# Patient Record
Sex: Female | Born: 1955 | Hispanic: Yes | Marital: Single | Smoking: Never smoker
Health system: Southern US, Community
[De-identification: ages and names within clinical notes are randomized; demographics above are authoritative.]

## PROBLEM LIST (undated history)

## (undated) DIAGNOSIS — I10 Essential (primary) hypertension: Secondary | ICD-10-CM

## (undated) HISTORY — PX: TUBAL LIGATION: SHX77

---

## 2020-06-16 ENCOUNTER — Emergency Department: Payer: HRSA Program

## 2020-06-16 ENCOUNTER — Emergency Department
Admission: EM | Admit: 2020-06-16 | Discharge: 2020-06-17 | Disposition: A | Discharge status: Home / Self Care | Payer: HRSA Program | Attending: Emergency Medicine | Admitting: Emergency Medicine

## 2020-06-16 ENCOUNTER — Encounter: Payer: Self-pay | Admitting: Emergency Medicine

## 2020-06-16 ENCOUNTER — Other Ambulatory Visit: Payer: Self-pay

## 2020-06-16 DIAGNOSIS — I10 Essential (primary) hypertension: Secondary | ICD-10-CM | POA: Diagnosis not present

## 2020-06-16 DIAGNOSIS — R059 Cough, unspecified: Secondary | ICD-10-CM | POA: Diagnosis present

## 2020-06-16 DIAGNOSIS — U071 COVID-19: Secondary | ICD-10-CM | POA: Diagnosis not present

## 2020-06-16 HISTORY — DX: Essential (primary) hypertension: I10

## 2020-06-16 LAB — BASIC METABOLIC PANEL
Anion gap: 10 (ref 5–15)
BUN: 11 mg/dL (ref 8–23)
CO2: 22 mmol/L (ref 22–32)
Calcium: 7.8 mg/dL — ABNORMAL LOW (ref 8.9–10.3)
Chloride: 103 mmol/L (ref 98–111)
Creatinine, Ser: 0.75 mg/dL (ref 0.44–1.00)
GFR, Estimated: >60 mL/min (ref >60)
Glucose, Bld: 160 mg/dL — ABNORMAL HIGH (ref 70–99)
Potassium: 3 mmol/L — ABNORMAL LOW (ref 3.5–5.1)
Sodium: 135 mmol/L (ref 135–145)

## 2020-06-16 LAB — CBC
HCT: 35.1 % — ABNORMAL LOW (ref 36.0–46.0)
Hemoglobin: 11.7 g/dL — ABNORMAL LOW (ref 12.0–15.0)
MCH: 28.4 pg (ref 26.0–34.0)
MCHC: 33.3 g/dL (ref 30.0–36.0)
MCV: 85.2 fL (ref 80.0–100.0)
Platelets: 201 10*3/uL (ref 150–400)
RBC: 4.12 MIL/uL (ref 3.87–5.11)
RDW: 12.9 % (ref 11.5–15.5)
WBC: 2.7 10*3/uL — ABNORMAL LOW (ref 4.0–10.5)
nRBC: 0 % (ref 0.0–0.2)

## 2020-06-16 LAB — POC SARS CORONAVIRUS 2 AG -  ED: SARS Coronavirus 2 Ag: POSITIVE — AB

## 2020-06-16 MED ORDER — POTASSIUM CHLORIDE CRYS ER 20 MEQ PO TBCR
40.0000 meq | EXTENDED_RELEASE_TABLET | Freq: Once | ORAL | Status: AC
  Administered 2020-06-17: 40 meq via ORAL
  Filled 2020-06-16: qty 2

## 2020-06-16 MED ORDER — ACETAMINOPHEN 500 MG PO TABS
1000.0000 mg | ORAL_TABLET | Freq: Once | ORAL | Status: AC
  Administered 2020-06-17: 1000 mg via ORAL
  Filled 2020-06-16: qty 2

## 2020-06-16 NOTE — ED Triage Notes (Signed)
Using spanish interpreter. Patient with complaint of cough times two days. Patient also states that she started feeling dizzy on Wednesday. Patient states that she thinks that she has been running fevers.

## 2020-06-16 NOTE — ED Provider Notes (Signed)
[facility] Emergency Department Provider Note  ____________________________________________   Event Date/Time   First MD Initiated Contact with Patient 06/16/20 2324     (approximate)  I have reviewed the triage vital signs and the nursing notes.   HISTORY  Chief Complaint Cough and Dizziness    HPI Mariah Mckee is a 65 y.o. female with HTN, obesity who presents to the ED with cough, fever, body aches, insomnia x 1 week.  No chest pain.  Has had intermittent SOB worse at night.  No vomiting or diarrhea.    Has not been tested for COVID or flu.  No COVID vaccine.  Has had flu vaccine.  No sick contacts.    Spanish interpretor used.        Past Medical History:  Diagnosis Date   Hypertension     There are no problems to display for this patient.   Past Surgical History:  Procedure Laterality Date   TUBAL LIGATION      Prior to Admission medications   Not on File    Allergies Patient has no known allergies.  No family history on file.  Social History Social History   Tobacco Use   Smoking status: Never Smoker   Smokeless tobacco: Never Used  Substance Use Topics   Alcohol use: Not Currently   Drug use: Not Currently    Review of Systems Constitutional: + fever. Eyes: No visual changes. ENT: No sore throat. Cardiovascular: Denies chest pain. Respiratory: + shortness of breath. Gastrointestinal: No nausea, vomiting, diarrhea. Genitourinary: Negative for dysuria. Musculoskeletal: Negative for back pain. Skin: Negative for rash. Neurological: Negative for focal weakness or numbness.  ____________________________________________   PHYSICAL EXAM:  VITAL SIGNS: ED Triage Vitals  Enc Vitals Group     BP 06/16/20 2210 103/87     Pulse Rate 06/16/20 2210 (!) 101     Resp 06/16/20 2210 20     Temp 06/16/20 2210 100.3 F (37.9 C)     Temp Source 06/16/20 2210 Oral     SpO2 06/16/20 2210 99 %     Weight  06/16/20 2211 245 lb (111.1 kg)     Height 06/16/20 2211 5\' 4"  (1.626 m)     Head Circumference --      Peak Flow --      Pain Score 06/16/20 2210 0     Pain Loc --      Pain Edu? --      Excl. in GC? --    CONSTITUTIONAL: Alert and oriented and responds appropriately to questions. Well-appearing; well-nourished HEAD: Normocephalic EYES: Conjunctivae clear, pupils appear equal, EOM appear intact ENT: normal nose; moist mucous membranes NECK: Supple, normal ROM CARD: RRR; S1 and S2 appreciated; no murmurs, no clicks, no rubs, no gallops RESP: Normal chest excursion without splinting or tachypnea; breath sounds clear and equal bilaterally; no wheezes, no rhonchi, no rales, no hypoxia or respiratory distress, speaking full sentences ABD/GI: Normal bowel sounds; non-distended; soft, non-tender, no rebound, no guarding, no peritoneal signs, no hepatosplenomegaly BACK: The back appears normal EXT: Normal ROM in all joints; no deformity noted, no edema; no cyanosis SKIN: Normal color for age and race; warm; no rash on exposed skin NEURO: Moves all extremities equally PSYCH: The patient's mood and manner are appropriate.  ____________________________________________   LABS (all labs ordered are listed, but only abnormal results are displayed)  Labs Reviewed  BASIC METABOLIC PANEL - Abnormal; Notable for the following components:  Result Value   Potassium 3.0 (*)    Glucose, Bld 160 (*)    Calcium 7.8 (*)    All other components within normal limits  CBC - Abnormal; Notable for the following components:   WBC 2.7 (*)    Hemoglobin 11.7 (*)    HCT 35.1 (*)    All other components within normal limits  POC SARS CORONAVIRUS 2 AG -  ED - Abnormal; Notable for the following components:   SARS Coronavirus 2 Ag Positive (*)    All other components within normal limits    ____________________________________________  EKG  none ____________________________________________  RADIOLOGY I, Om [place], personally viewed and evaluated these images (plain radiographs) as part of my medical decision making, as well as reviewing the written report by the radiologist.  ED MD interpretation: Multifocal pneumonia.   Official radiology report(s): DG Chest 2 View  Result Date: 06/16/2020 CLINICAL DATA:  Cough and fever EXAM: CHEST - 2 VIEW COMPARISON:  None. FINDINGS: The heart size and mediastinal contours are within normal limits. Multifocal ill-defined nodular patchy airspace opacities are seen throughout both lungs. No pleural effusion. No acute osseous abnormality. IMPRESSION: Ill-defined multifocal patchy airspace opacities throughout both lungs which could be due to multifocal pneumonia. Electronically Signed   By: Jonna Clark M.D.   On: 06/16/2020 22:50    ____________________________________________   PROCEDURES  Procedure(s) performed (including Critical Care): None ____________________________________________   INITIAL IMPRESSION / ASSESSMENT AND PLAN / ED COURSE  As part of my medical decision making, I reviewed the following data within the electronic MEDICAL RECORD NUMBER Nursing notes reviewed and incorporated, Interpreter needed, Labs reviewed, Radiograph reviewed and Notes from prior ED visits         Patient here with 1 week of fever, body aches, cough, intermittent shortness of breath.  Chest x-ray concerning for multifocal pneumonia and she is slightly leukopenic here.  Discussed with patient that I suspect this is a viral illness.  Likely COVID-19.  Rapid Covid test performed in the room and was positive.  Patient was able to ambulate throughout the room and sats did not drop below 94%.  She is satting 99% on room air at rest and has no increased work of breathing, tachypnea, respiratory distress.  Discussed with patient using the  interpreter that she is outside of treatment window for most outpatient treatments including monoclonal antibodies, antivirals, remdesivir.  Given Tylenol here for fever.  Recommended alternating Tylenol and Motrin at home.  Recommended quarantine for 10 full days after the onset of symptoms.  Discussed return precautions.  I feel she is safe to be discharged.  At this time, I do not feel there is any life-threatening condition present. I have reviewed, interpreted and discussed all results (EKG, imaging, lab, urine as appropriate) and exam findings with patient/family. I have reviewed nursing notes and appropriate previous records.  I feel the patient is safe to be discharged home without further emergent workup and can continue workup as an outpatient as needed. Discussed usual and customary return precautions. Patient/family verbalize understanding and are comfortable with this plan.  Outpatient follow-up has been provided as needed. All questions have been answered.   ____________________________________________   FINAL CLINICAL IMPRESSION(S) / ED DIAGNOSES  Final diagnoses:  COVID-19     ED Discharge Orders    None      *Please note:  Mariah Mckee was evaluated in Emergency Department on 06/17/2020 for the symptoms described in the history of present illness. She was  evaluated in the context of the global COVID-19 pandemic, which necessitated consideration that the patient might be at risk for infection with the SARS-CoV-2 virus that causes COVID-19. Institutional protocols and algorithms that pertain to the evaluation of patients at risk for COVID-19 are in a state of rapid change based on information released by regulatory bodies including the CDC and federal and state organizations. These policies and algorithms were followed during the patient's care in the ED.  Some ED evaluations and interventions may be delayed as a result of limited staffing during and the pandemic.*   Note:   This document was prepared using Dragon voice recognition software and may include unintentional dictation errors.   Ashyra Cantin, Layla Maw, DO 06/17/20 [phone]

## 2020-06-17 ENCOUNTER — Other Ambulatory Visit: Payer: Self-pay | Admitting: Physician Assistant

## 2020-06-17 DIAGNOSIS — U071 COVID-19: Secondary | ICD-10-CM

## 2020-06-17 NOTE — Discharge Instructions (Addendum)
You may alternate Tylenol 1000 mg every 6 hours as needed for pain, fever and Ibuprofen 800 mg every 8 hours as needed for pain, fever.  Please take Ibuprofen with food.  Do not take more than 4000 mg of Tylenol (acetaminophen) in a 24 hour period.  These medications are found over-the-counter.   Steps to find a Primary Care Provider (PCP):  Call [phone] or [phone] to access "[place] Find a Doctor Service."  2.  You may also go on the [facility] website at [email or web address]

## 2020-06-17 NOTE — Progress Notes (Signed)
I connected by phone with Mariah Mckee on 06/17/2020 at 2:15 PM to discuss the potential use of a new treatment for mild to moderate COVID-19 viral infection in non-hospitalized patients.  This patient is a 65 y.o. female that meets the FDA criteria for Emergency Use Authorization of COVID monoclonal antibody sotrovimab.  Has a (+) direct SARS-CoV-2 viral test result  Has mild or moderate COVID-19   Is NOT hospitalized due to COVID-19  Is within 10 days of symptom onset  Has at least one of the high risk factor(s) for progression to severe COVID-19 and/or hospitalization as defined in EUA.  Specific high risk criteria : BMI > 25 and Other high risk medical condition per CDC:  unvaccinated, high SVI   I have spoken and communicated the following to the patient or parent/caregiver regarding COVID monoclonal antibody treatment:  1. FDA has authorized the emergency use for the treatment of mild to moderate COVID-19 in adults and pediatric patients with positive results of direct SARS-CoV-2 viral testing who are 80 years of age and older weighing at least 40 kg, and who are at high risk for progressing to severe COVID-19 and/or hospitalization.  2. The significant known and potential risks and benefits of COVID monoclonal antibody, and the extent to which such potential risks and benefits are unknown.  3. Information on available alternative treatments and the risks and benefits of those alternatives, including clinical trials.  4. Patients treated with COVID monoclonal antibody should continue to self-isolate and use infection control measures (e.g., wear mask, isolate, social distance, avoid sharing personal items, clean and disinfect "high touch" surfaces, and frequent handwashing) according to CDC guidelines.   5. The patient or parent/caregiver has the option to accept or refuse COVID monoclonal antibody treatment.  After reviewing this information with the patient, the patient has  agreed to receive one of the available covid 19 monoclonal antibodies and will be provided an appropriate fact sheet prior to infusion.   Sx onset 2/1. Set up for infusion tomorrow 2/8 @ 11:30am. Pt is uninsured.   Cline Crock, PA-C 06/17/2020 2:15 PM

## 2020-06-17 NOTE — ED Notes (Signed)
Patient ambulated in room for 2 min, oxygen 94-99% on RA

## 2020-06-18 ENCOUNTER — Ambulatory Visit (HOSPITAL_COMMUNITY)
Admission: RE | Admit: 2020-06-18 | Discharge: 2020-06-18 | Disposition: A | Discharge status: Home / Self Care | Payer: HRSA Program | Source: Ambulatory Visit | Attending: Pulmonary Disease | Admitting: Pulmonary Disease

## 2020-06-18 DIAGNOSIS — U071 COVID-19: Secondary | ICD-10-CM | POA: Insufficient documentation

## 2020-06-18 MED ORDER — SODIUM CHLORIDE 0.9 % IV SOLN: Freq: Once | INTRAVENOUS | Status: AC

## 2020-06-18 MED ORDER — SODIUM CHLORIDE 0.9 % IV SOLN: INTRAVENOUS | Status: DC | PRN

## 2020-06-18 MED ORDER — ALBUTEROL SULFATE HFA 108 (90 BASE) MCG/ACT IN AERS: 2.0000 | INHALATION_SPRAY | Freq: Once | RESPIRATORY_TRACT | Status: DC | PRN

## 2020-06-18 MED ORDER — SOTROVIMAB 500 MG/8ML IV SOLN
500.0000 mg | Freq: Once | INTRAVENOUS | Status: AC
  Administered 2020-06-18: 500 mg via INTRAVENOUS

## 2020-06-18 MED ORDER — ACETAMINOPHEN 325 MG PO TABS
650.0000 mg | ORAL_TABLET | Freq: Four times a day (QID) | ORAL | Status: DC | PRN
  Administered 2020-06-18: 650 mg via ORAL
  Filled 2020-06-18: qty 2

## 2020-06-18 MED ORDER — EPINEPHRINE 0.3 MG/0.3ML IJ SOAJ: 0.3000 mg | Freq: Once | INTRAMUSCULAR | Status: DC | PRN

## 2020-06-18 MED ORDER — METHYLPREDNISOLONE SODIUM SUCC 125 MG IJ SOLR: 125.0000 mg | Freq: Once | INTRAMUSCULAR | Status: DC | PRN

## 2020-06-18 MED ORDER — FAMOTIDINE IN NACL 20-0.9 MG/50ML-% IV SOLN: 20.0000 mg | Freq: Once | INTRAVENOUS | Status: DC | PRN

## 2020-06-18 MED ORDER — DIPHENHYDRAMINE HCL 50 MG/ML IJ SOLN: 50.0000 mg | Freq: Once | INTRAMUSCULAR | Status: DC | PRN

## 2020-06-18 NOTE — Discharge Instructions (Signed)
10 cosas que puede hacer para controlar los sntomas de COVID-19 en casa 10 Things You Can Do to Manage Your COVID-19 Symptoms at Home Si tiene la infeccin por COVID-19 posible o confirmada: 1. Qudese en su casa, excepto para obtener atencin mdica. 2. Preste atencin a sus sntomas cuidadosamente. Si sus sntomas empeoran, llame al mdico de inmediato. 3. Descanse y mantngase hidratado. 4. Si tiene una cita mdica, llame al mdico con anticipacin e infrmele que tiene o puede tener COVID-19. 5. Si tiene una emergencia mdica, llame al 911 y avise al personal de despacho que tiene o puede tener COVID-19. 6. Al toser y al estornudar, cbrase la boca y la nariz con un pauelo descartable o con el pliegue del codo. 7. Lvese las manos con frecuencia con agua y jabn durante al menos 20 segundos, o bien lmpiese las manos con un desinfectante de manos a base de alcohol que contenga al menos un 60% de alcohol. 8. En la mayor medida posible, permanezca en una habitacin especfica y lejos de otras personas en su hogar. Adems, debe utilizar un bao aparte, si es posible. Si necesita estar cerca de otras personas dentro o fuera de la casa, use una mascarilla. 9. Evite compartir objetos personales con otras personas de la casa, como platos, toallas y ropa de cama. 10. Limpie todas las superficies que se tocan con frecuencia, como encimeras, mesas y picaportes. Utilice los aerosoles o las toallitas hmedas de limpieza domstica segn las instrucciones de la etiqueta. cdc.gov/coronavirus 11/09/2018 Esta informacin no tiene como fin reemplazar el consejo del mdico. Asegrese de hacerle al mdico cualquier pregunta que tenga. Document Revised: 03/11/2020 Document Reviewed: 03/11/2020 Elsevier Patient Education  2021 Elsevier Inc. What types of side effects do monoclonal antibody drugs cause?  Common side effects  In general, the more common side effects caused by monoclonal antibody drugs  include: . Allergic reactions, such as hives or itching . Flu-like signs and symptoms, including chills, fatigue, fever, and muscle aches and pains . Nausea, vomiting . Diarrhea . Skin rashes . Low blood pressure   The CDC is recommending patients who receive monoclonal antibody treatments wait at least 90 days before being vaccinated.  Currently, there are no data on the safety and efficacy of mRNA COVID-19 vaccines in persons who received monoclonal antibodies or convalescent plasma as part of COVID-19 treatment. Based on the estimated half-life of such therapies as well as evidence suggesting that reinfection is uncommon in the 90 days after initial infection, vaccination should be deferred for at least 90 days, as a precautionary measure until additional information becomes available, to avoid interference of the antibody treatment with vaccine-induced immune responses. If you have any questions or concerns after the infusion please call the Advanced Practice Provider on call at [phone]. This number is ONLY intended for your use regarding questions or concerns about the infusion post-treatment side-effects.  Please do not provide this number to others for use. For return to work notes please contact your primary care provider.   If someone you know is interested in receiving treatment please have them contact their MD for a referral or visit [email or web address]    

## 2020-06-18 NOTE — Progress Notes (Signed)
Patient reviewed Fact Sheet for Patients, Parents, and Caregivers for Emergency Use Authorization (EUA) of sotrovimab for the Treatment of Coronavirus. Patient also reviewed and is agreeable to the estimated cost of treatment. Patient is agreeable to proceed.   

## 2020-06-18 NOTE — Progress Notes (Signed)
Diagnosis: COVID-19  Physician: Dr. Patrick Wright  Procedure: [facility] Infusion [facility] Med: Sotrovimab infusion - Provided patient with sotrovimab fact sheet for patients, parents, and caregivers prior to infusion.   Complications: No immediate complications noted  Discharge: Discharged home    

## 2023-01-19 IMAGING — CR DG CHEST 2V
2 series · 2 of 2 positions shown · non-contrast
Comparison: None.

CLINICAL DATA: Cough and fever

EXAM:
CHEST - 2 VIEW

[chest pa]
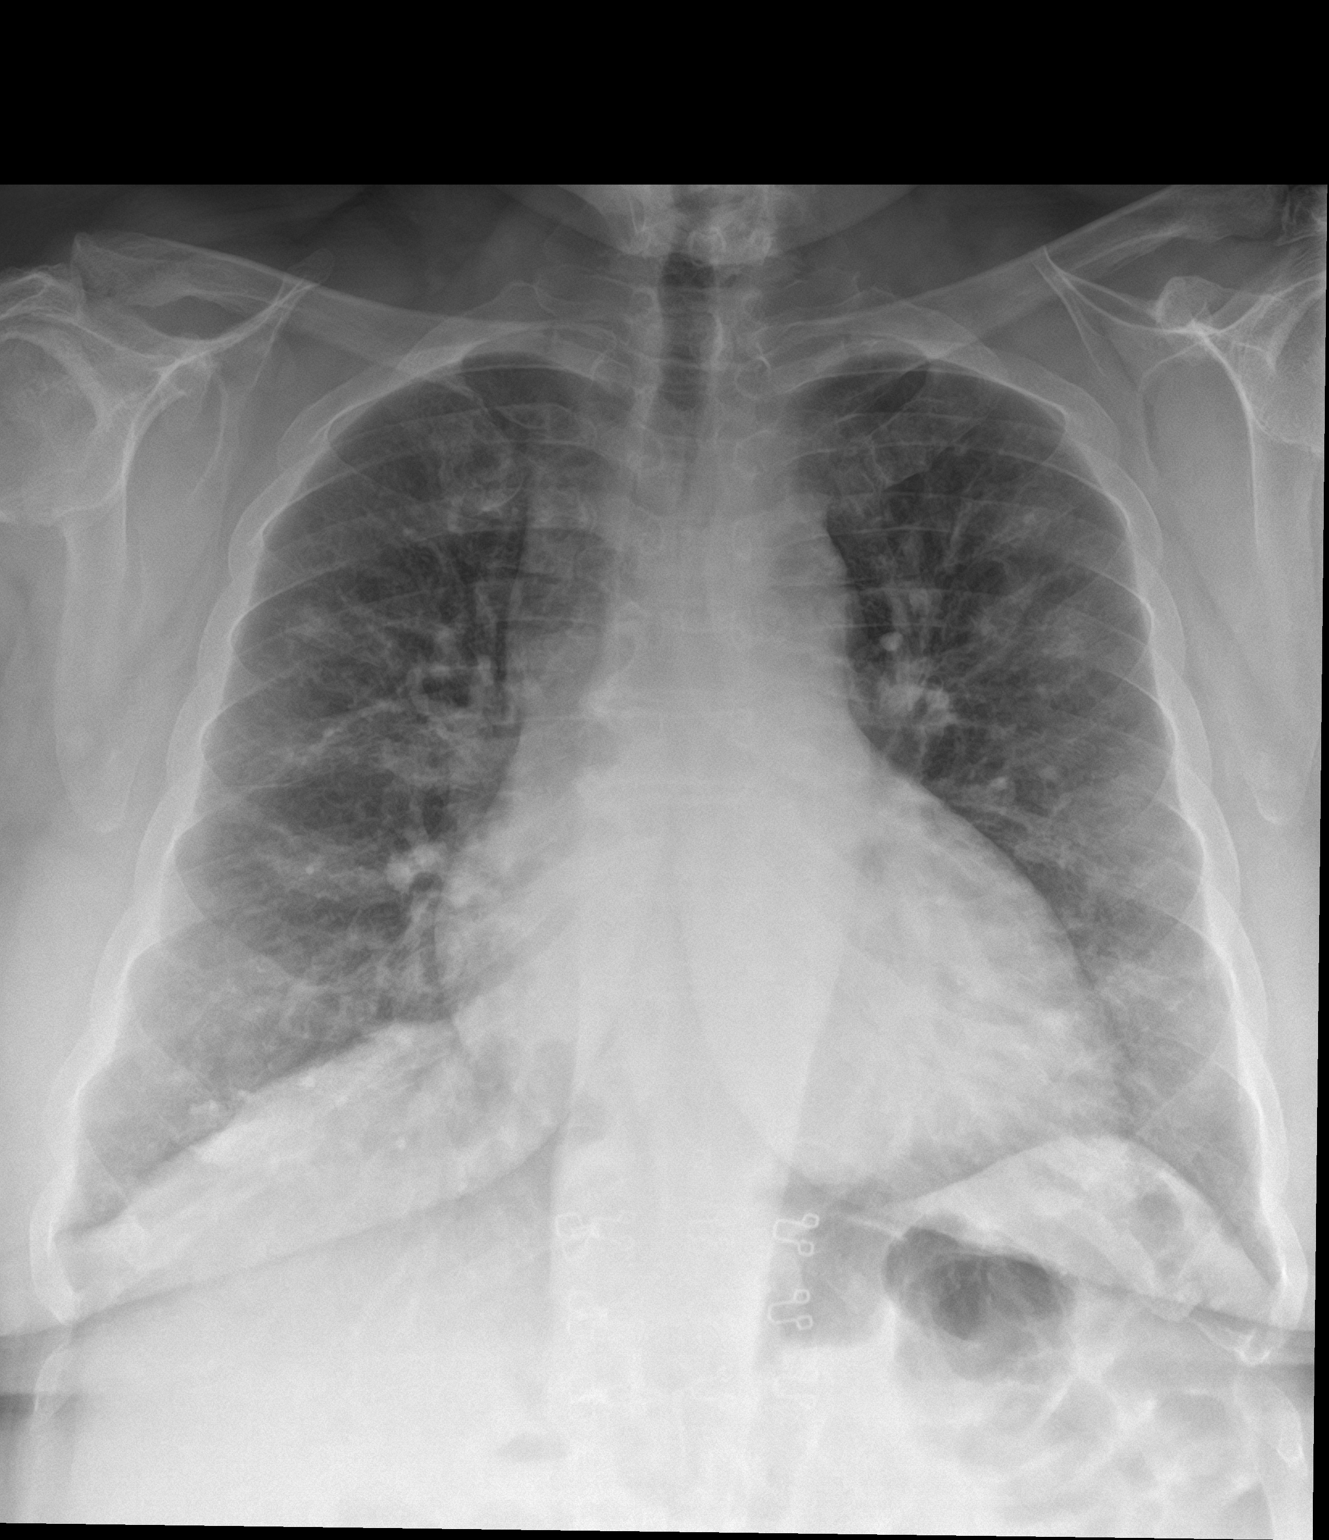

[chest lat]
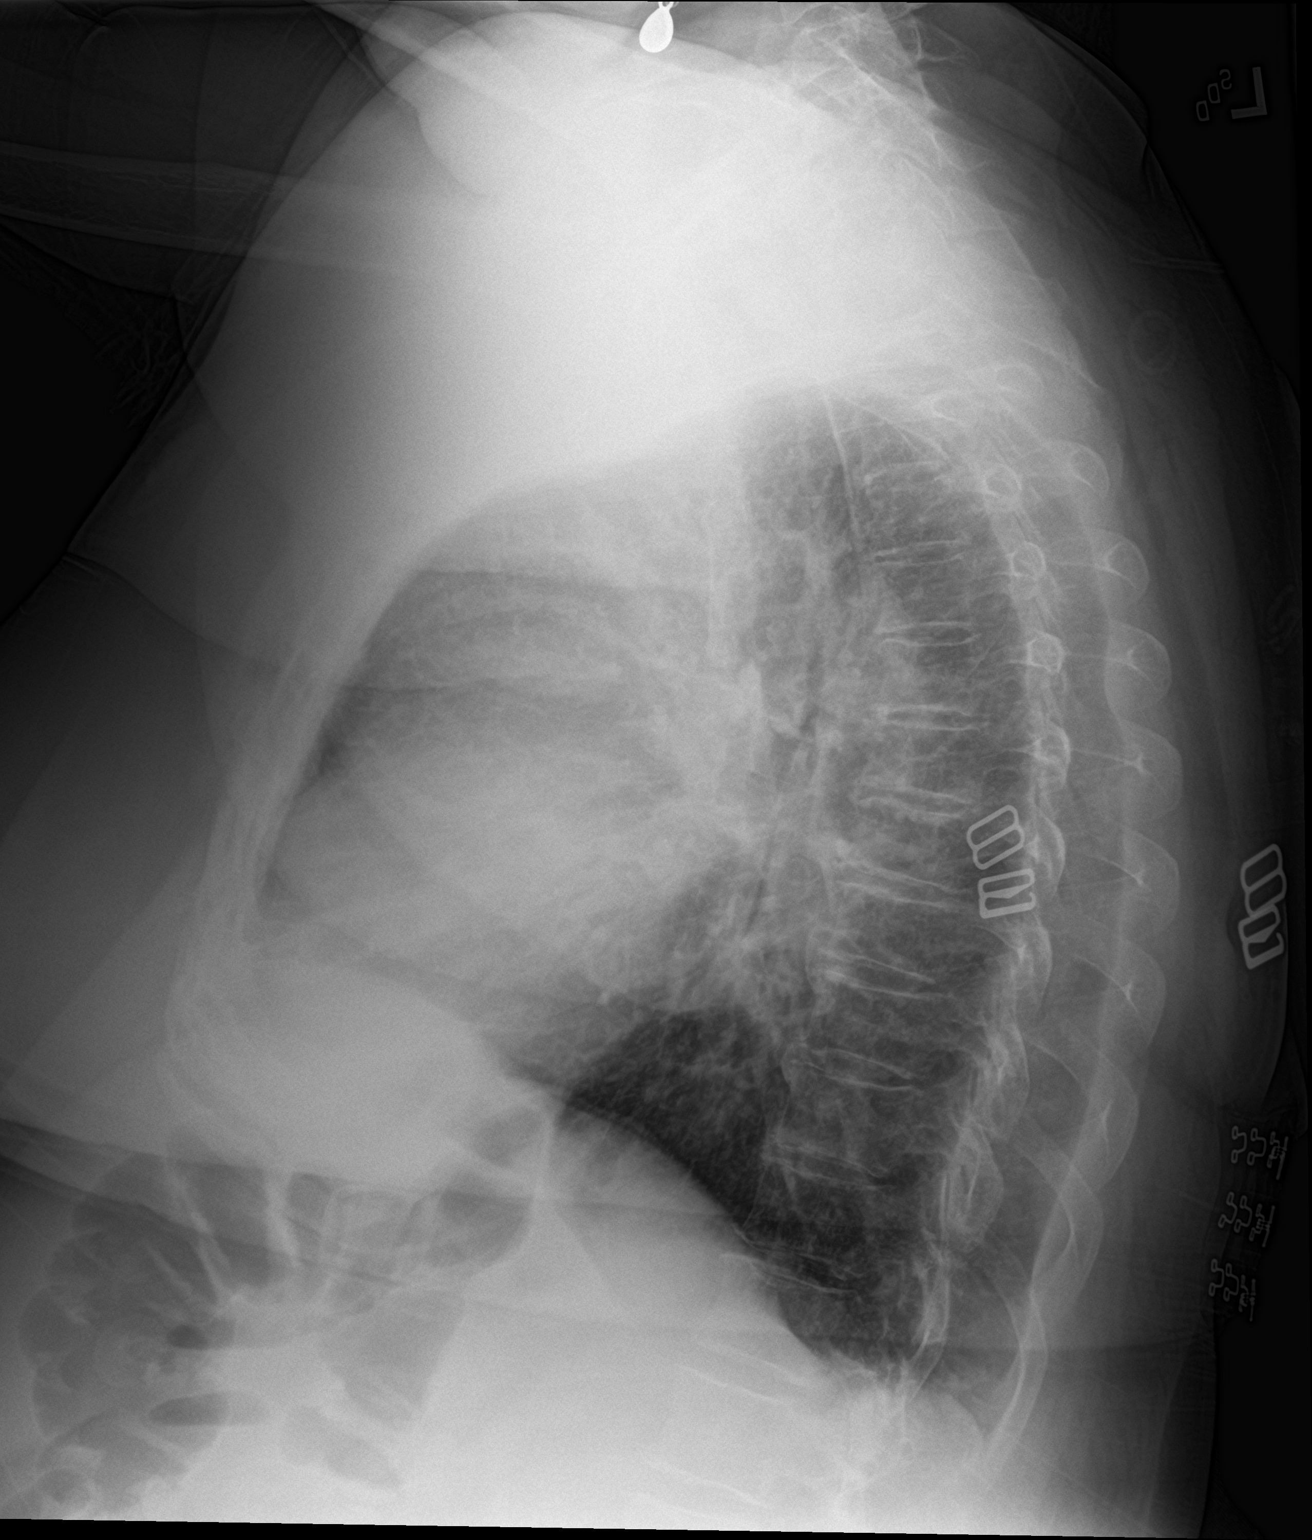

[2 of 2 positions shown; findings below may reference images not displayed]

FINDINGS: The heart size and mediastinal contours are within normal limits.
Multifocal ill-defined nodular patchy airspace opacities are seen
throughout both lungs. No pleural effusion. No acute osseous
abnormality.
IMPRESSION: Ill-defined multifocal patchy airspace opacities throughout both
lungs which could be due to multifocal pneumonia.
